# Patient Record
Sex: Male | Born: 1990 | Race: White | Hispanic: No | Marital: Single | Smoking: Current every day smoker
Health system: Southern US, Community
[De-identification: ages and names within clinical notes are randomized; demographics above are authoritative.]

## PROBLEM LIST (undated history)

## (undated) DIAGNOSIS — Z5189 Encounter for other specified aftercare: Secondary | ICD-10-CM

## (undated) DIAGNOSIS — Z978 Presence of other specified devices: Secondary | ICD-10-CM

## (undated) DIAGNOSIS — F1910 Other psychoactive substance abuse, uncomplicated: Secondary | ICD-10-CM

## (undated) DIAGNOSIS — R569 Unspecified convulsions: Secondary | ICD-10-CM

## (undated) HISTORY — DX: Unspecified convulsions: R56.9

## (undated) HISTORY — DX: Encounter for other specified aftercare: Z51.89

## (undated) HISTORY — DX: Other psychoactive substance abuse, uncomplicated: F19.10

---
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
## 2010-12-14 LAB — PROTIME-INR
INR: 1
Prothrombin Time: 13.9

## 2010-12-14 LAB — POCT I-STAT, CHEM 8
BUN: 13
Calcium, Ion: 1.19
Chloride: 103
Creatinine, Ser: 1.1
Glucose, Bld: 110 — ABNORMAL HIGH
Hemoglobin: 15.3
Potassium: 3.6
Sodium: 141
TCO2: 28

## 2010-12-14 LAB — CBC
HCT: 42.6
MCV: 88.4

## 2012-12-18 DIAGNOSIS — R739 Hyperglycemia, unspecified: Secondary | ICD-10-CM | POA: Insufficient documentation

## 2012-12-18 DIAGNOSIS — F1410 Cocaine abuse, uncomplicated: Secondary | ICD-10-CM | POA: Insufficient documentation

## 2012-12-18 DIAGNOSIS — F1190 Opioid use, unspecified, uncomplicated: Secondary | ICD-10-CM | POA: Insufficient documentation

## 2012-12-18 DIAGNOSIS — F1210 Cannabis abuse, uncomplicated: Secondary | ICD-10-CM | POA: Insufficient documentation

## 2013-03-18 DIAGNOSIS — R569 Unspecified convulsions: Secondary | ICD-10-CM

## 2013-03-18 HISTORY — DX: Unspecified convulsions: R56.9

## 2015-09-15 ENCOUNTER — Ambulatory Visit: Payer: Self-pay | Admitting: Family Medicine

## 2015-09-21 ENCOUNTER — Encounter: Payer: Self-pay | Admitting: Family Medicine

## 2015-09-21 ENCOUNTER — Ambulatory Visit (INDEPENDENT_AMBULATORY_CARE_PROVIDER_SITE_OTHER): Payer: Federal, State, Local not specified - PPO | Admitting: Family Medicine

## 2015-09-21 VITALS — BP 103/62 | HR 59 | Temp 97.5°F | Ht 72.0 in | Wt 166.8 lb

## 2015-09-21 DIAGNOSIS — M79609 Pain in unspecified limb: Secondary | ICD-10-CM

## 2015-09-21 DIAGNOSIS — M25512 Pain in left shoulder: Secondary | ICD-10-CM | POA: Diagnosis not present

## 2015-09-21 DIAGNOSIS — T8789 Other complications of amputation stump: Secondary | ICD-10-CM | POA: Diagnosis not present

## 2015-09-21 NOTE — Patient Instructions (Signed)
Great to meet you!  I recommend evaluation by a disability doctor for your disability evaluation.   Consider discussing your shoulder pain with your orthopedic surgeon, they may also be a good place to discuss disability with  For your back pain- try tylenol and core strengthening exercises for long term improvement. See the handout  We will call with a referral to a pain doctor
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
## 2017-02-03 ENCOUNTER — Emergency Department (HOSPITAL_COMMUNITY): Payer: Federal, State, Local not specified - PPO

## 2017-02-03 ENCOUNTER — Encounter (HOSPITAL_COMMUNITY): Payer: Self-pay | Admitting: Emergency Medicine

## 2017-02-03 ENCOUNTER — Emergency Department (HOSPITAL_COMMUNITY)
Admission: EM | Admit: 2017-02-03 | Discharge: 2017-02-04 | Disposition: A | Discharge status: Home / Self Care | Payer: Federal, State, Local not specified - PPO | Attending: Emergency Medicine | Admitting: Emergency Medicine

## 2017-02-03 DIAGNOSIS — Y999 Unspecified external cause status: Secondary | ICD-10-CM | POA: Diagnosis not present

## 2017-02-03 DIAGNOSIS — S060X9A Concussion with loss of consciousness of unspecified duration, initial encounter: Secondary | ICD-10-CM | POA: Insufficient documentation

## 2017-02-03 DIAGNOSIS — S0081XA Abrasion of other part of head, initial encounter: Secondary | ICD-10-CM | POA: Insufficient documentation

## 2017-02-03 DIAGNOSIS — F17210 Nicotine dependence, cigarettes, uncomplicated: Secondary | ICD-10-CM | POA: Diagnosis not present

## 2017-02-03 DIAGNOSIS — S3991XA Unspecified injury of abdomen, initial encounter: Secondary | ICD-10-CM | POA: Diagnosis not present

## 2017-02-03 DIAGNOSIS — Y92410 Unspecified street and highway as the place of occurrence of the external cause: Secondary | ICD-10-CM | POA: Insufficient documentation

## 2017-02-03 DIAGNOSIS — S161XXA Strain of muscle, fascia and tendon at neck level, initial encounter: Secondary | ICD-10-CM | POA: Insufficient documentation

## 2017-02-03 DIAGNOSIS — Z23 Encounter for immunization: Secondary | ICD-10-CM | POA: Insufficient documentation

## 2017-02-03 DIAGNOSIS — S299XXA Unspecified injury of thorax, initial encounter: Secondary | ICD-10-CM | POA: Insufficient documentation

## 2017-02-03 DIAGNOSIS — R400 Somnolence: Secondary | ICD-10-CM | POA: Diagnosis not present

## 2017-02-03 DIAGNOSIS — S0990XA Unspecified injury of head, initial encounter: Secondary | ICD-10-CM | POA: Diagnosis present

## 2017-02-03 DIAGNOSIS — Y939 Activity, unspecified: Secondary | ICD-10-CM | POA: Insufficient documentation

## 2017-02-03 DIAGNOSIS — S298XXA Other specified injuries of thorax, initial encounter: Secondary | ICD-10-CM

## 2017-02-03 HISTORY — DX: Presence of other specified devices: Z97.8

## 2017-02-03 MED ORDER — TETANUS-DIPHTH-ACELL PERTUSSIS 5-2.5-18.5 LF-MCG/0.5 IM SUSP
0.5000 mL | Freq: Once | INTRAMUSCULAR | Status: AC
  Administered 2017-02-03: 0.5 mL via INTRAMUSCULAR
  Filled 2017-02-03: qty 0.5

## 2017-02-03 MED ORDER — IOPAMIDOL (ISOVUE-300) INJECTION 61%
INTRAVENOUS | Status: AC
  Filled 2017-02-03: qty 100
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
## 2017-02-04 ENCOUNTER — Emergency Department (HOSPITAL_COMMUNITY): Payer: Federal, State, Local not specified - PPO

## 2017-02-04 LAB — CBC
HEMATOCRIT: 38.3 % — AB (ref 39.0–52.0)
HEMOGLOBIN: 13.2 g/dL (ref 13.0–17.0)
MCH: 30.3 pg (ref 26.0–34.0)
MCHC: 34.5 g/dL (ref 30.0–36.0)
MCV: 88 fL (ref 78.0–100.0)
Platelets: 180 10*3/uL (ref 150–400)
RBC: 4.35 MIL/uL (ref 4.22–5.81)
RDW: 13.2 % (ref 11.5–15.5)
WBC: 11.1 10*3/uL — ABNORMAL HIGH (ref 4.0–10.5)

## 2017-02-04 LAB — SAMPLE TO BLOOD BANK

## 2017-02-04 LAB — COMPREHENSIVE METABOLIC PANEL
ALBUMIN: 3.9 g/dL (ref 3.5–5.0)
ALK PHOS: 65 U/L (ref 38–126)
ALT: 16 U/L — ABNORMAL LOW (ref 17–63)
ANION GAP: 8 (ref 5–15)
AST: 28 U/L (ref 15–41)
BUN: 12 mg/dL (ref 6–20)
CHLORIDE: 103 mmol/L (ref 101–111)
CO2: 24 mmol/L (ref 22–32)
Calcium: 8.9 mg/dL (ref 8.9–10.3)
Creatinine, Ser: 0.88 mg/dL (ref 0.61–1.24)
GFR calc Af Amer: >60 mL/min (ref >60)
GFR calc non Af Amer: >60 mL/min (ref >60)
GLUCOSE: 93 mg/dL (ref 65–99)
POTASSIUM: 3.8 mmol/L (ref 3.5–5.1)
SODIUM: 135 mmol/L (ref 135–145)
Total Bilirubin: 0.3 mg/dL (ref 0.3–1.2)
Total Protein: 6.1 g/dL — ABNORMAL LOW (ref 6.5–8.1)

## 2017-02-04 LAB — PROTIME-INR
INR: 1.04
Prothrombin Time: 13.5 seconds (ref 11.4–15.2)

## 2017-02-04 LAB — CDS SEROLOGY

## 2017-02-04 MED ORDER — IOPAMIDOL (ISOVUE-300) INJECTION 61%
100.0000 mL | Freq: Once | INTRAVENOUS | Status: AC | PRN
  Administered 2017-02-04: 100 mL via INTRAVENOUS
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
## 2021-04-06 ENCOUNTER — Ambulatory Visit: Payer: Federal, State, Local not specified - PPO | Admitting: Nurse Practitioner

## 2022-09-05 ENCOUNTER — Ambulatory Visit: Payer: Medicaid Other | Admitting: Nurse Practitioner

## 2022-09-05 ENCOUNTER — Encounter: Payer: Self-pay | Admitting: Nurse Practitioner

## 2022-09-05 VITALS — BP 112/64 | HR 86 | Temp 98.3°F | Ht 72.0 in | Wt 165.8 lb

## 2022-09-05 DIAGNOSIS — Z0001 Encounter for general adult medical examination with abnormal findings: Secondary | ICD-10-CM

## 2022-09-05 DIAGNOSIS — Z716 Tobacco abuse counseling: Secondary | ICD-10-CM

## 2022-09-05 DIAGNOSIS — S78111S Complete traumatic amputation at level between right hip and knee, sequela: Secondary | ICD-10-CM | POA: Insufficient documentation

## 2022-09-05 DIAGNOSIS — F10229 Alcohol dependence with intoxication, unspecified: Secondary | ICD-10-CM | POA: Diagnosis not present

## 2022-09-05 DIAGNOSIS — Z7689 Persons encountering health services in other specified circumstances: Secondary | ICD-10-CM | POA: Insufficient documentation

## 2022-09-05 DIAGNOSIS — M79609 Pain in unspecified limb: Secondary | ICD-10-CM

## 2022-09-05 DIAGNOSIS — F1920 Other psychoactive substance dependence, uncomplicated: Secondary | ICD-10-CM

## 2022-09-05 DIAGNOSIS — T8789 Other complications of amputation stump: Secondary | ICD-10-CM

## 2022-09-05 DIAGNOSIS — Z0000 Encounter for general adult medical examination without abnormal findings: Secondary | ICD-10-CM | POA: Insufficient documentation

## 2022-09-05 DIAGNOSIS — F1921 Other psychoactive substance dependence, in remission: Secondary | ICD-10-CM | POA: Insufficient documentation

## 2022-09-05 NOTE — Progress Notes (Signed)
New Patient Office Visit  Subjective    Patient ID: Johnny Lewis, male    DOB: 1990-04-04  Age: 32 y.o. MRN: [id number]  CC:  Chief Complaint  Patient presents with   Establish Care    HPI Johnny Lewis presents to establish care with physical. Reports being health with no past medical dx and currently not taking any medication. Works as an Geophysicist/field seismologist care, pain).  Right above the knee amputation 2010, denies stump pain Smokes 1/2 pack daily, started smoking at 32 yrs old; not ready to quit [facility] opioid sober for 1-yr. Denies mental and physical cravings Endorse drinking  daily drinking 12 packs in one sitting, started ETOH at 32 yrs old. Denies the needs for counseling or medications  No recent labs   Outpatient Encounter Medications as of 09/05/2022  Medication Sig   fexofenadine (ALLEGRA) 60 MG tablet Take 60 mg by mouth 2 (two) times daily.   No facility-administered encounter medications on file as of 09/05/2022.    Past Medical History:  Diagnosis Date   Blood transfusion without reported diagnosis 09/2008   Seizures (HCC) 2015   Substance abuse (HCC)    32 years old until 32 years old   Uses prosthesis    R leg    Past Surgical History:  Procedure Laterality Date   FRACTURE SURGERY  2010   broken right leg    Family History  Problem Relation Age of Onset   Heart disease Maternal Grandmother    Hypertension Maternal Grandmother    Hearing loss Paternal Grandfather     Social History   Socioeconomic History   Marital status: Single    Spouse name: Not on file   Number of children: Not on file   Years of education: Not on file   Highest education level: Not on file  Occupational History   Not on file  Tobacco Use   Smoking status: Every Day    Packs/day: .5    Types: Cigarettes   Smokeless tobacco: Never  Vaping Use   Vaping Use: Never used  Substance and Sexual Activity   Alcohol use: Yes    Alcohol/week: 20.0 standard drinks of alcohol     Types: 20 Cans of beer per week   Drug use: Not Currently   Sexual activity: Yes  Other Topics Concern   Not on file  Social History Narrative   Not on file   Social Determinants of Health   Financial Resource Strain: Medium Risk (09/05/2022)   Overall Financial Resource Strain (CARDIA)    Difficulty of Paying Living Expenses: Somewhat hard  Food Insecurity: No Food Insecurity (09/05/2022)   Hunger Vital Sign    Worried About Running Out of Food in the Last Year: Never true    Ran Out of Food in the Last Year: Never true  Transportation Needs: No Transportation Needs (09/05/2022)   PRAPARE - Administrator, Civil Service (Medical): No    Lack of Transportation (Non-Medical): No  Physical Activity: Not on file  Stress: Not on file  Social Connections: Not on file  Intimate Partner Violence: Not on file    ROS Negative unless indicated in HPI   Objective    BP 112/64   Pulse 86   Temp 98.3 F (36.8 C) (Temporal)   Ht 6' (1.829 m)   Wt 165 lb 12.8 oz (75.2 kg)   SpO2 97%   BMI 22.49 kg/m   Physical Exam Vitals and nursing  note reviewed.  Constitutional:      Appearance: Normal appearance. He is not ill-appearing.  HENT:     Head: Normocephalic and atraumatic.  Eyes:     General: No scleral icterus.    Extraocular Movements: Extraocular movements intact.     Conjunctiva/sclera: Conjunctivae normal.     Pupils: Pupils are equal, round, and reactive to light.  Cardiovascular:     Rate and Rhythm: Normal rate.     Heart sounds: Normal heart sounds.  Pulmonary:     Effort: Pulmonary effort is normal.     Breath sounds: Normal breath sounds.  Abdominal:     General: Abdomen is flat. Bowel sounds are normal.     Palpations: Abdomen is soft.     Tenderness: There is no abdominal tenderness. There is no right CVA tenderness, left CVA tenderness, guarding or rebound.  Musculoskeletal:        General: Normal range of motion.     Cervical back: Normal  range of motion and neck supple.     Comments: No stump, wear a prothesis     Right Lower Extremity: Right leg is amputated above knee.  Skin:    General: Skin is warm and dry.     Findings: No rash.     Comments: BUE tattoos   Neurological:     General: No focal deficit present.     Mental Status: He is alert and oriented to person, place, and time. Mental status is at baseline.  Psychiatric:        Attention and Perception: Attention and perception normal.        Mood and Affect: Affect is not flat.        Speech: Speech normal.        Behavior: Behavior normal. Behavior is not slowed. Behavior is cooperative.        Thought Content: Thought content normal. Thought content does not include homicidal or suicidal ideation. Thought content does not include homicidal or suicidal plan.        Cognition and Memory: Cognition and memory normal.        Judgment: Judgment normal.     Comments: Very guarded     Last CBC Lab Results  Component Value Date   WBC 11.1 (H) 02/03/2017   HGB 13.2 02/03/2017   HCT 38.3 (L) 02/03/2017   MCV 88.0 02/03/2017   MCH 30.3 02/03/2017   RDW 13.2 02/03/2017   PLT 180 02/03/2017   Last metabolic panel Lab Results  Component Value Date   GLUCOSE 93 02/03/2017   NA 135 02/03/2017   K 3.8 02/03/2017   CL 103 02/03/2017   CO2 24 02/03/2017   BUN 12 02/03/2017   CREATININE 0.88 02/03/2017   GFRNONAA >60 02/03/2017   CALCIUM 8.9 02/03/2017   PROT 6.1 (L) 02/03/2017   ALBUMIN 3.9 02/03/2017   BILITOT 0.3 02/03/2017   ALKPHOS 65 02/03/2017   AST 28 02/03/2017   ALT 16 (L) 02/03/2017   ANIONGAP 8 02/03/2017         Assessment & Plan:  Encounter to establish care -     CBC with Differential/Platelet -     CMP14+EGFR -     Lipid panel -     Thyroid Panel With TSH  Routine medical exam  Alcohol dependence with intoxication with complication (HCC) -     CBC with Differential/Platelet -     CMP14+EGFR  Encounter for smoking cessation  counseling  Stump pain (HCC) -  CBC with Differential/Platelet  Polysubstance (including opioids) dependence with physiol dependence (HCC)  ETOH abuse  refused treatment Polysubstance use  refused treatment Labs: CBC, CMP, Lipid, TSH Stump pain resolved Labs: CBC, CMP, Lipid, TSH, A1C  Return in about 6 months (around 03/07/2023).   Arrie Aran Santa Lighter, NP

## 2022-09-06 LAB — CBC WITH DIFFERENTIAL/PLATELET
Basophils Absolute: 0 10*3/uL (ref 0.0–0.2)
Basos: 0 %
EOS (ABSOLUTE): 0.2 10*3/uL (ref 0.0–0.4)
Eos: 3 %
Hematocrit: 42.3 % (ref 37.5–51.0)
Hemoglobin: 14 g/dL (ref 13.0–17.7)
Immature Grans (Abs): 0 10*3/uL (ref 0.0–0.1)
Immature Granulocytes: 0 %
Lymphocytes Absolute: 1.9 10*3/uL (ref 0.7–3.1)
Lymphs: 35 %
MCH: 29.3 pg (ref 26.6–33.0)
MCHC: 33.1 g/dL (ref 31.5–35.7)
MCV: 89 fL (ref 79–97)
Monocytes Absolute: 0.3 10*3/uL (ref 0.1–0.9)
Monocytes: 6 %
Neutrophils Absolute: 3.1 10*3/uL (ref 1.4–7.0)
Neutrophils: 56 %
Platelets: 207 10*3/uL (ref 150–450)
RBC: 4.78 x10E6/uL (ref 4.14–5.80)
RDW: 13 % (ref 11.6–15.4)
WBC: 5.5 10*3/uL (ref 3.4–10.8)

## 2022-09-06 LAB — CMP14+EGFR
ALT: 13 IU/L (ref 0–44)
AST: 14 IU/L (ref 0–40)
Albumin: 4.6 g/dL (ref 4.1–5.1)
Alkaline Phosphatase: 80 IU/L (ref 44–121)
BUN/Creatinine Ratio: 24 — ABNORMAL HIGH (ref 9–20)
BUN: 22 mg/dL — ABNORMAL HIGH (ref 6–20)
Bilirubin Total: 0.3 mg/dL (ref 0.0–1.2)
CO2: 24 mmol/L (ref 20–29)
Calcium: 9.3 mg/dL (ref 8.7–10.2)
Chloride: 104 mmol/L (ref 96–106)
Creatinine, Ser: 0.91 mg/dL (ref 0.76–1.27)
Globulin, Total: 2 g/dL (ref 1.5–4.5)
Glucose: 104 mg/dL — ABNORMAL HIGH (ref 70–99)
Potassium: 4.1 mmol/L (ref 3.5–5.2)
Sodium: 142 mmol/L (ref 134–144)
Total Protein: 6.6 g/dL (ref 6.0–8.5)
eGFR: 116 mL/min/{1.73_m2} (ref >59)

## 2022-09-06 LAB — BAYER DCA HB A1C WAIVED: HB A1C (BAYER DCA - WAIVED): 5.6 % (ref 4.8–5.6)

## 2022-09-06 LAB — THYROID PANEL WITH TSH
Free Thyroxine Index: 2 (ref 1.2–4.9)
T3 Uptake Ratio: 29 % (ref 24–39)
T4, Total: 6.8 ug/dL (ref 4.5–12.0)
TSH: 1.04 u[IU]/mL (ref 0.450–4.500)

## 2022-09-06 LAB — LIPID PANEL
Chol/HDL Ratio: 3.4 ratio (ref 0.0–5.0)
Cholesterol, Total: 144 mg/dL (ref 100–199)
HDL: 42 mg/dL (ref >39)
LDL Chol Calc (NIH): 84 mg/dL (ref 0–99)
Triglycerides: 97 mg/dL (ref 0–149)
VLDL Cholesterol Cal: 18 mg/dL (ref 5–40)

## 2022-12-25 ENCOUNTER — Ambulatory Visit: Payer: Medicaid Other | Admitting: Nurse Practitioner

## 2022-12-25 ENCOUNTER — Encounter: Payer: Self-pay | Admitting: Nurse Practitioner

## 2022-12-26 ENCOUNTER — Ambulatory Visit: Payer: Medicaid Other | Admitting: Nurse Practitioner

## 2022-12-26 ENCOUNTER — Encounter: Payer: Self-pay | Admitting: Nurse Practitioner

## 2022-12-26 VITALS — BP 109/60 | HR 94 | Temp 98.2°F | Ht 72.0 in | Wt 153.4 lb

## 2022-12-26 DIAGNOSIS — R634 Abnormal weight loss: Secondary | ICD-10-CM | POA: Diagnosis not present

## 2022-12-26 MED ORDER — ENSURE MAX PROTEIN PO LIQD: 11.0000 [oz_av] | Freq: Every day | ORAL | 2 refills | Status: DC

## 2022-12-26 NOTE — Progress Notes (Signed)
Established Patient Office Visit  Subjective   Patient ID: Johnny Lewis, male    DOB: 1990-07-30  Age: 32 y.o. MRN: [id number]  Chief Complaint  Patient presents with   infection    Had infection on finger and didn't realize until infection spread and has lost a bunch of weight in past 1.5 months    HPI Weight loss Last weigh 09/04/21 165 lbs, today 12/26/22 153 lbs oncerns of unintentional weight loss, having lost 12 pounds since the last visit. He reports a history of engaging in unprotected sex with multiple partners, totaling six partners in the last five months. The patient denies any accompanying symptoms such as fever, night sweats, or changes in appetite. There are no other significant changes in health or lifestyle noted since the last visit. 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, right, sequela (HCC) 09/05/2022   Stump pain (HCC) 09/21/2015   Cocaine abuse (HCC) 12/18/2012   Hyperglycemia 12/18/2012   Marijuana abuse 12/18/2012   Opiate use 12/18/2012   Past Medical History:  Diagnosis Date   Blood transfusion without reported diagnosis 09/2008   Seizures (HCC) 2015   Substance abuse (HCC)    32 years old until 32 years old   Uses prosthesis    R leg   Past Surgical History:  Procedure Laterality Date   FRACTURE SURGERY  2010   broken right leg   Social History    Tobacco Use   Smoking status: Every Day    Current packs/day: 0.50    Types: Cigarettes   Smokeless tobacco: Never  Vaping Use   Vaping status: Never Used  Substance Use Topics   Alcohol use: Yes    Alcohol/week: 20.0 standard drinks of alcohol    Types: 20 Cans of beer per week   Drug use: Not Currently   Social History   Socioeconomic History   Marital status: Single    Spouse name: Not on file   Number of children: Not on file   Years of education: Not on file   Highest education level: Not on file  Occupational History   Not on file  Tobacco Use   Smoking status: Every Day    Current packs/day: 0.50    Types: Cigarettes   Smokeless tobacco: Never  Vaping Use   Vaping status: Never Used  Substance and Sexual Activity   Alcohol use: Yes    Alcohol/week: 20.0 standard drinks of alcohol    Types: 20 Cans of beer per week   Drug use: Not Currently   Sexual activity: Yes  Other Topics Concern   Not on file  Social History Narrative   Not on file   Social Determinants of Health   Financial Resource Strain: Medium Risk (09/05/2022)   Overall Financial Resource Strain (CARDIA)    Difficulty of Paying Living Expenses: Somewhat hard  Food Insecurity: No Food Insecurity (09/05/2022)   Hunger Vital Sign    Worried About Running  Out of Food in the Last Year: Never true    Ran Out of Food in the Last Year: Never true  Transportation Needs: No Transportation Needs (09/05/2022)   PRAPARE - Administrator, Civil Service (Medical): No    Lack of Transportation (Non-Medical): No  Physical Activity: Not on file  Stress: Not on file  Social Connections: Not on file  Intimate Partner Violence: Not on file   Family Status  Relation Name Status   Mother  Alive   Father  Alive   Brother  Alive   MGM  Deceased   MGF  Deceased   PGM  Deceased   PGF  Deceased  No partnership data on file   Family History  Problem Relation Age of Onset   Heart disease  Maternal Grandmother    Hypertension Maternal Grandmother    Hearing loss Paternal Grandfather    No Known Allergies    Review of Systems  Constitutional:  Positive for weight loss. Negative for chills, fever and malaise/fatigue.       Unintentional  HENT:  Negative for ear pain and sore throat.   Respiratory:  Negative for hemoptysis, shortness of breath and wheezing.   Cardiovascular:  Negative for chest pain and leg swelling.  Genitourinary:  Negative for frequency, hematuria and urgency.  Musculoskeletal:  Negative for falls.  Skin:  Negative for itching and rash.  Neurological:  Negative for dizziness, tingling and headaches.  Endo/Heme/Allergies:  Negative for environmental allergies and polydipsia. Does not bruise/bleed easily.  Psychiatric/Behavioral:  Negative for depression and hallucinations. The patient does not have insomnia.    Negative unless indicated in HPI   Objective:     BP 109/60   Pulse 94   Temp 98.2 F (36.8 C) (Temporal)   Ht 6' (1.829 m)   Wt 153 lb 6.4 oz (69.6 kg)   SpO2 97%   BMI 20.80 kg/m  BP Readings from Last 3 Encounters:  12/26/22 109/60  09/05/22 112/64  02/04/17 122/74   Wt Readings from Last 3 Encounters:  12/26/22 153 lb 6.4 oz (69.6 kg)  09/05/22 165 lb 12.8 oz (75.2 kg)  02/03/17 175 lb (79.4 kg)      Physical Exam Constitutional:      Appearance: Normal appearance. He is normal weight.  HENT:     Head: Normocephalic and atraumatic.  Eyes:     Extraocular Movements: Extraocular movements intact.     Conjunctiva/sclera: Conjunctivae normal.     Pupils: Pupils are equal, round, and reactive to light.  Cardiovascular:     Rate and Rhythm: Normal rate and regular rhythm.     Pulses: Normal pulses.     Heart sounds: Normal heart sounds.  Pulmonary:     Effort: Pulmonary effort is normal.     Breath sounds: Normal breath sounds.  Musculoskeletal:     Cervical back: Normal range of motion and neck supple.  Skin:     General: Skin is warm and dry.     Findings: No rash.  Neurological:     Mental Status: He is alert and oriented to person, place, and time. Mental status is at baseline.  Psychiatric:        Mood and Affect: Mood normal.        Behavior: Behavior normal.        Thought Content: Thought content normal.        Judgment: Judgment normal.      No results found for any  visits on 12/26/22.  Last CBC Lab Results  Component Value Date   WBC 5.5 09/05/2022   HGB 14.0 09/05/2022   HCT 42.3 09/05/2022   MCV 89 09/05/2022   MCH 29.3 09/05/2022   RDW 13.0 09/05/2022   PLT 207 09/05/2022   Last metabolic panel Lab Results  Component Value Date   GLUCOSE 104 (H) 09/05/2022   NA 142 09/05/2022   K 4.1 09/05/2022   CL 104 09/05/2022   CO2 24 09/05/2022   BUN 22 (H) 09/05/2022   CREATININE 0.91 09/05/2022   EGFR 116 09/05/2022   CALCIUM 9.3 09/05/2022   PROT 6.6 09/05/2022   ALBUMIN 4.6 09/05/2022   LABGLOB 2.0 09/05/2022   BILITOT 0.3 09/05/2022   ALKPHOS 80 09/05/2022   AST 14 09/05/2022   ALT 13 09/05/2022   ANIONGAP 8 02/03/2017   Last lipids Lab Results  Component Value Date   CHOL 144 09/05/2022   HDL 42 09/05/2022   LDLCALC 84 09/05/2022   TRIG 97 09/05/2022   CHOLHDL 3.4 09/05/2022   Last hemoglobin A1c Lab Results  Component Value Date   HGBA1C 5.6 09/05/2022   Last thyroid functions Lab Results  Component Value Date   TSH 1.040 09/05/2022   T4TOTAL 6.8 09/05/2022        Assessment & Plan:  Weight loss, non-intentional -     HIV Antibody (routine testing w rflx) -     PE and FLC, Serum -     Hepatic function panel -     C-reactive protein -     CBC with Differential/Platelet -     Ensure Max Protein; Take 330 mLs (11 oz total) by mouth daily.  Dispense: 330 mL; Refill: 2 -     HepB+HepC+HIV Panel -     Herpes simplex virus culture -     RPR   Johnny Lewis is 76 yrs olf male, no acute distress Unintentional weight loss: Ensure BID Labs: C-reactive  protein, CBC,HepB+HepC+HIV Panel, Herpes simplex virus culture   PE and FLC, Serum, RPR     -Boost supplement 1-can daily  Encourage healthy lifestyle choices, including diet (rich in fruits, vegetables, and lean proteins, and low in salt and simple carbohydrates) and exercise (at least 30 minutes of moderate physical activity daily).     The above assessment and management plan was discussed with the patient. The patient verbalized understanding of and has agreed to the management plan. Patient is aware to call the clinic if they develop any new symptoms or if symptoms persist or worsen. Patient is aware when to return to the clinic for a follow-up visit. Patient educated on when it is appropriate to go to the emergency department.  Return in about 6 weeks (around 02/06/2023).    Arrie Aran Santa Lighter, DNP [facility] [address] [place], [place] [address] [phone]

## 2022-12-29 LAB — PE AND FLC, SERUM
A/G Ratio: 1.4 (ref 0.7–1.7)
Albumin ELP: 3.7 g/dL (ref 2.9–4.4)
Alpha 1: 0.3 g/dL (ref 0.0–0.4)
Alpha 2: 0.7 g/dL (ref 0.4–1.0)
Beta: 0.9 g/dL (ref 0.7–1.3)
Gamma Globulin: 0.8 g/dL (ref 0.4–1.8)
Globulin, Total: 2.7 g/dL (ref 2.2–3.9)
Ig Kappa Free Light Chain: 11.9 mg/L (ref 3.3–19.4)
Ig Lambda Free Light Chain: 14.5 mg/L (ref 5.7–26.3)
KAPPA/LAMBDA RATIO: 0.82 (ref 0.26–1.65)
Total Protein: 6.4 g/dL (ref 6.0–8.5)

## 2022-12-29 LAB — CBC WITH DIFFERENTIAL/PLATELET
Basophils Absolute: 0.1 10*3/uL (ref 0.0–0.2)
Basos: 1 %
EOS (ABSOLUTE): 0.5 10*3/uL — ABNORMAL HIGH (ref 0.0–0.4)
Eos: 10 %
Hematocrit: 40.5 % (ref 37.5–51.0)
Hemoglobin: 13.1 g/dL (ref 13.0–17.7)
Immature Grans (Abs): 0 10*3/uL (ref 0.0–0.1)
Immature Granulocytes: 0 %
Lymphocytes Absolute: 1.4 10*3/uL (ref 0.7–3.1)
Lymphs: 30 %
MCH: 29.4 pg (ref 26.6–33.0)
MCHC: 32.3 g/dL (ref 31.5–35.7)
MCV: 91 fL (ref 79–97)
Monocytes Absolute: 0.4 10*3/uL (ref 0.1–0.9)
Monocytes: 9 %
Neutrophils Absolute: 2.4 10*3/uL (ref 1.4–7.0)
Neutrophils: 50 %
Platelets: 302 10*3/uL (ref 150–450)
RBC: 4.45 x10E6/uL (ref 4.14–5.80)
RDW: 12.8 % (ref 11.6–15.4)
WBC: 4.8 10*3/uL (ref 3.4–10.8)

## 2022-12-29 LAB — HEPB+HEPC+HIV PANEL
HIV Screen 4th Generation wRfx: NONREACTIVE
Hep B C IgM: NEGATIVE
Hep B Core Total Ab: NEGATIVE
Hep B E Ab: NONREACTIVE
Hep B E Ag: NEGATIVE
Hep B Surface Ab, Qual: NONREACTIVE
Hep C Virus Ab: NONREACTIVE
Hepatitis B Surface Ag: NEGATIVE

## 2022-12-29 LAB — C-REACTIVE PROTEIN: CRP: 2 mg/L (ref 0–10)

## 2022-12-29 LAB — HEPATIC FUNCTION PANEL
ALT: 22 [IU]/L (ref 0–44)
AST: 26 [IU]/L (ref 0–40)
Albumin: 4.1 g/dL (ref 4.1–5.1)
Alkaline Phosphatase: 70 [IU]/L (ref 44–121)
Bilirubin Total: <0.2 mg/dL (ref 0.0–1.2)
Bilirubin, Direct: <0.1 mg/dL (ref 0.00–0.40)

## 2022-12-29 LAB — RPR: RPR Ser Ql: NONREACTIVE

## 2023-01-30 ENCOUNTER — Ambulatory Visit: Payer: Medicaid Other | Admitting: Nurse Practitioner

## 2023-02-10 ENCOUNTER — Ambulatory Visit: Payer: Medicaid Other | Admitting: Nurse Practitioner

## 2023-02-10 NOTE — Progress Notes (Deleted)
Established Patient Office Visit  Subjective   Patient ID: Johnny Lewis, male    DOB: Jun 16, 1990  Age: 32 y.o. MRN: [id number]  No chief complaint on file.   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, right, sequela (HCC) 09/05/2022   Stump pain (HCC) 09/21/2015   Cocaine abuse (HCC) 12/18/2012   Hyperglycemia 12/18/2012   Marijuana abuse 12/18/2012   Opiate use 12/18/2012   Past Medical History:  Diagnosis Date   Blood transfusion without reported diagnosis 09/2008   Seizures (HCC) 2015   Substance abuse (HCC)    32 years old until 32 years old   Uses prosthesis    R leg   Past Surgical History:  Procedure Laterality Date   FRACTURE SURGERY  2010   broken right leg   Social History   Tobacco Use   Smoking status: Every Day    Current packs/day: 0.50    Types: Cigarettes   Smokeless tobacco: Never  Vaping Use   Vaping status: Never Used  Substance Use Topics   Alcohol use: Yes    Alcohol/week: 20.0 standard drinks of alcohol    Types: 20 Cans of beer per week   Drug use: Not Currently   Social History   Socioeconomic History   Marital status: Single    Spouse name: Not on file   Number of children: Not on file   Years of education: Not on file   Highest education level: Not on file  Occupational History   Not on file  Tobacco Use   Smoking status: Every Day    Current packs/day: 0.50    Types: Cigarettes   Smokeless tobacco: Never  Vaping Use   Vaping status:  Never Used  Substance and Sexual Activity   Alcohol use: Yes    Alcohol/week: 20.0 standard drinks of alcohol    Types: 20 Cans of beer per week   Drug use: Not Currently   Sexual activity: Yes  Other Topics Concern   Not on file  Social History Narrative   Not on file   Social Determinants of Health   Financial Resource Strain: Medium Risk (09/05/2022)   Overall Financial Resource Strain (CARDIA)    Difficulty of Paying Living Expenses: Somewhat hard  Food Insecurity: No Food Insecurity (09/05/2022)   Hunger Vital Sign    Worried About Running Out of Food in the Last Year: Never true    Ran Out of Food in the Last Year: Never true  Transportation Needs: No Transportation Needs (09/05/2022)   PRAPARE - Administrator, Civil Service (Medical): No    Lack of Transportation (Non-Medical): No  Physical Activity: Not on file  Stress: Not on file  Social Connections: Not on file  Intimate Partner Violence: Not on file   Family Status  Relation Name Status   Mother  Alive   Father  Alive   Brother  Alive   MGM  Deceased   MGF  Deceased   PGM  Deceased   PGF  Deceased  No partnership data on file  Family History  Problem Relation Age of Onset   Heart disease Maternal Grandmother    Hypertension Maternal Grandmother    Hearing loss Paternal Grandfather    No Known Allergies    ROS Negative unless indicated in HPI   Objective:     There were no vitals taken for this visit. BP Readings from Last 3 Encounters:  12/26/22 109/60  09/05/22 112/64  02/04/17 122/74   Wt Readings from Last 3 Encounters:  12/26/22 153 lb 6.4 oz (69.6 kg)  09/05/22 165 lb 12.8 oz (75.2 kg)  02/03/17 175 lb (79.4 kg)      Physical Exam   No results found for any visits on 02/10/23.  Last CBC Lab Results  Component Value Date   WBC 4.8 12/26/2022   HGB 13.1 12/26/2022   HCT 40.5 12/26/2022   MCV 91 12/26/2022   MCH 29.4 12/26/2022   RDW 12.8 12/26/2022   PLT 302  12/26/2022   Last metabolic panel Lab Results  Component Value Date   GLUCOSE 104 (H) 09/05/2022   NA 142 09/05/2022   K 4.1 09/05/2022   CL 104 09/05/2022   CO2 24 09/05/2022   BUN 22 (H) 09/05/2022   CREATININE 0.91 09/05/2022   EGFR 116 09/05/2022   CALCIUM 9.3 09/05/2022   PROT 6.4 12/26/2022   ALBUMIN 4.1 12/26/2022   LABGLOB 2.7 12/26/2022   AGRATIO 1.4 12/26/2022   BILITOT <0.2 12/26/2022   ALKPHOS 70 12/26/2022   AST 26 12/26/2022   ALT 22 12/26/2022   ANIONGAP 8 02/03/2017   Last lipids Lab Results  Component Value Date   CHOL 144 09/05/2022   HDL 42 09/05/2022   LDLCALC 84 09/05/2022   TRIG 97 09/05/2022   CHOLHDL 3.4 09/05/2022   Last hemoglobin A1c Lab Results  Component Value Date   HGBA1C 5.6 09/05/2022   Last thyroid functions Lab Results  Component Value Date   TSH 1.040 09/05/2022   T4TOTAL 6.8 09/05/2022        Assessment & Plan:  There are no diagnoses linked to this encounter.  No follow-ups on file.    Arrie Aran Santa Lighter, DNP [facility] [address] [place], [place] [address] [phone]

## 2023-02-11 ENCOUNTER — Encounter: Payer: Self-pay | Admitting: Nurse Practitioner

## 2023-07-31 ENCOUNTER — Encounter: Payer: Self-pay | Admitting: Nurse Practitioner

## 2023-07-31 ENCOUNTER — Ambulatory Visit: Admitting: Nurse Practitioner

## 2023-07-31 VITALS — BP 129/72 | HR 82 | Temp 98.1°F | Ht 72.0 in | Wt 164.2 lb

## 2023-07-31 DIAGNOSIS — L309 Dermatitis, unspecified: Secondary | ICD-10-CM

## 2023-07-31 DIAGNOSIS — J321 Chronic frontal sinusitis: Secondary | ICD-10-CM | POA: Diagnosis not present

## 2023-07-31 MED ORDER — AZITHROMYCIN 250 MG PO TABS: ORAL_TABLET | ORAL | 0 refills | Status: DC

## 2023-07-31 MED ORDER — AZELASTINE HCL 0.1 % NA SOLN: 1.0000 | Freq: Two times a day (BID) | NASAL | 12 refills | Status: DC

## 2023-07-31 NOTE — Progress Notes (Signed)
 Acute Office Visit  Subjective:     Patient ID: Johnny Lewis, male    DOB: 26-Oct-1990, 33 y.o.   MRN: [id number]  Chief Complaint  Patient presents with   Nasal Congestion    Symptoms for 2 weeks    Cough   Referral    Wants referral to dermatology thinks he has eczema and rash on his leg     HPI Johnny Lewis is a 33 yrs old male presents 07/31/2023 for an acute need referral   fro dermatology. He is very poor historian Reports that his mother wants him to get a referral to Derm for Eczema. " It comes and goes may last 1-2 doses dry skin around nasal fold". Has try hydrocortisone with no relief.  .Sinus Pain: Patient complains of congestion, nasal congestion, post nasal drip, and productive cough with  yellow colored sputum. Symptoms include nasal congestion, post nasal drip, and productive cough with  yellow colored sputum with no fever, chills, night sweats or weight loss. Onset of symptoms was 3 weeks ago, gradually worsening since that time. He is drinking moderate amounts of fluids.  Past history is significant for no history of pneumonia or bronchitis.   . 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  HENT:  Positive for congestion and sinus pain.   Respiratory:  Negative for cough, shortness of breath and wheezing.   Cardiovascular:  Negative for chest pain and leg swelling.  Gastrointestinal:  Negative for nausea and vomiting.  Neurological:  Negative for dizziness and headaches.   Negative unless indicated in HPI    Objective:    BP 129/72   Pulse 82   Temp 98.1 F (36.7 C) (Temporal)   Ht 6' (1.829 m)   Wt 164 lb 3.2 oz (74.5 kg)   SpO2 95%   BMI 22.27 kg/m  BP Readings from Last 3 Encounters:  07/31/23 129/72  12/26/22 109/60  09/05/22 112/64   Wt Readings from Last 3 Encounters:  07/31/23 164 lb 3.2 oz (74.5 kg)  12/26/22 153 lb 6.4 oz (69.6 kg)  09/05/22 165 lb 12.8 oz (75.2 kg)      Physical Exam Constitutional:      General: He is not in acute distress.    Appearance: Normal appearance.  HENT:     Head: Normocephalic and atraumatic.     Right Ear: Tympanic membrane, ear canal and external ear normal. There is no impacted cerumen.     Left Ear: Tympanic membrane, ear canal and  external ear normal. There is no impacted cerumen.     Nose: Congestion and rhinorrhea present.     Right Turbinates: Swollen.     Left Turbinates: Swollen.  Eyes:     General: No scleral icterus.    Extraocular Movements: Extraocular movements intact.     Conjunctiva/sclera: Conjunctivae normal.     Pupils: Pupils are equal, round, and reactive to light.  Cardiovascular:     Heart sounds: Normal heart sounds.  Pulmonary:     Effort: Pulmonary effort is normal.     Breath sounds: Normal breath sounds.  Musculoskeletal:        General: Normal range of motion.     Right lower leg: No edema.     Left lower leg: No edema.  Skin:    General:  Skin is warm and dry.     Findings: No rash.  Neurological:     Mental Status: He is alert and oriented to person, place, and time. Mental status is at baseline.     No results found for any visits on 07/31/23.      Assessment & Plan:  Eczema, unspecified type -     Ambulatory referral to Dermatology  Sinusitis chronic, frontal -     Azithromycin; 2 tabs on the first day and 1-tab daily until done  Dispense: 6 each; Refill: 0 -     Azelastine HCl; Place 1 spray into both nostrils 2 (two) times daily. Use in each nostril as directed  Dispense: 30 mL; Refill: 12   [name] is a 33 year old Caucasian male seen today for eczema and sinusitis, no acute distress Eczema: No active rash, as requested refer to Derm Sinusitis: Astelin twice daily, Zithromax No. 6 follow instruction on - increase hydration 1. Take meds as prescribed 2. Use a cool mist humidifier especially during the winter months and when heat has been humid. 3. Use saline nose sprays frequently. 4. Saline irrigations of the nose can be very helpful if done frequently. Use 4 times daily for one week. Can use Nettie Pot if able to tolerate.  5. Drink plenty of fluids 6. Keep thermostat turned down low. 7.For any cough or congestion: Use plain Mucinex- regular strength or max strength is fine. 8. For fever or aces or pains- take tylenol or ibuprofen appropriate for age and weight, for fevers greater than 101 orally you may alternate ibuprofen and tylenol every 3 hours.    The above assessment and management plan was discussed with the patient. The patient verbalized understanding of and has agreed to the management plan. Patient is aware to call the clinic if they develop any new symptoms or if symptoms persist or worsen. Patient is aware when to return to the clinic for a follow-up visit. Patient educated on when it is appropriate to go to the emergency department.  Return if symptoms worsen or fail to improve.  Johnny Ruane St  Louis Thompson, DNP [facility] [address] [place], [place] [address] [phone]  Note: This document was prepared by [employer] voice dictation technology and any errors that results from this process are unintentional.

## 2023-10-27 ENCOUNTER — Ambulatory Visit: Admitting: Nurse Practitioner

## 2023-10-27 ENCOUNTER — Encounter: Payer: Self-pay | Admitting: Nurse Practitioner

## 2023-10-27 VITALS — BP 93/58 | HR 84 | Temp 98.5°F | Ht 72.0 in | Wt 153.0 lb

## 2023-10-27 DIAGNOSIS — L719 Rosacea, unspecified: Secondary | ICD-10-CM

## 2023-10-27 MED ORDER — METRONIDAZOLE 1 % EX GEL: Freq: Every day | CUTANEOUS | 0 refills | Status: DC

## 2023-10-27 NOTE — Patient Instructions (Signed)
 Rosacea Rosacea is a long-term (chronic) condition that affects the skin of the face, including the cheeks, nose, forehead, and chin. This condition can also affect the eyes. Rosacea causes blood vessels near the surface of the skin to enlarge, which results in redness. What are the causes? The cause of this condition is not known. Certain triggers can make rosacea worse, including: Exercise. Sunlight. Very hot or cold temperatures. Hot or spicy foods and drinks. Drinking alcohol. Stress. Taking blood pressure medicine. Long-term use of topical steroids on the face. What increases the risk? You are more likely to develop this condition if you: Are older than 33 years of age. Are a woman. Have light-colored skin (light complexion). Have a family history of rosacea. What are the signs or symptoms? Symptoms of this condition include: Redness of the face. Red bumps or pimples on the face. A red, enlarged nose. Blushing easily. Red lines on the skin. Eye problems such as: Irritated, burning, or itchy feeling in the eyes. Swollen eyelids. Drainage from the eyes. Feeling like there is something in your eye. How is this diagnosed? This condition is diagnosed with a medical history and physical exam. How is this treated? There is no cure for this condition, but treatment can help to control your symptoms. Your health care provider may recommend that you see a skin specialist (dermatologist). Treatment may include: Medicines that are applied to the skin or taken by mouth (orally). This can include antibiotic medicines. Laser treatment to improve the appearance of the skin. Surgery. This is rare. Your health care provider will also recommend the best way to take care of your skin. Even after your skin improves, you will likely need to continue treatment to prevent your rosacea from coming back. Follow these instructions at home: Skin care Take care of your skin as told by your health  care provider. You may be told to do these things: Wash your skin gently two or more times each day. Use mild soap. Use a sunscreen or sunblock with SPF 30 or greater. Use gentle cosmetics that are meant for sensitive skin. Shave with an electric shaver instead of a blade. Lifestyle Try to keep track of what foods trigger this condition. Avoid any triggers. These may include: Spicy foods. Seafood. Cheese. Hot liquids. Nuts. Chocolate. Iodized salt. Do not drink alcohol. Avoid extremely cold or hot temperatures. Try to reduce your stress. If you need help, talk with your health care provider. When you exercise, do these things to stay cool: Limit sun exposure to your face. Use a fan. Do shorter and more frequent intervals of exercise. General instructions Take and apply over-the-counter and prescription medicines only as told by your health care provider. If you were prescribed antibiotics, apply it or take them as told by your health care provider. Do not stop using the antibiotic even if your condition improves. If your eyelids are affected, apply warm compresses to them. Do this as told by your health care provider. Keep all follow-up visits. Contact a health care provider if: Your symptoms get worse. Your symptoms do not improve after 2 months of treatment. You have new symptoms. You have any changes in vision or you have problems with your eyes, such as redness or itching. You feel depressed. You lose your appetite. You have trouble concentrating. Summary Rosacea is a long-term (chronic) condition that affects the skin of the face, including the cheeks, nose, forehead, and chin. Take care of your skin as told by your health care  provider. Take and apply over-the-counter and prescription medicines only as told by your health care provider. Contact a health care provider if your symptoms get worse or if you have any changes in vision or other problems with your eyes, such as  redness or itching. Keep all follow-up visits. This information is not intended to replace advice given to you by your health care provider. Make sure you discuss any questions you have with your health care provider. Document Revised: 04/25/2021 Document Reviewed: 04/25/2021 Elsevier Patient Education  2024 [employer].

## 2023-10-27 NOTE — Progress Notes (Signed)
   Subjective:    Patient ID: Johnny Lewis, male    DOB: 07/30/1990, 33 y.o.   MRN: [id number]   Chief Complaint: rash on face  Rash Pertinent negatives include no eye pain or shortness of breath.    Patient has rash on bil cheeks. He has had frequently in the past. Occurs several times a year. Slight burning. 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  Eyes:  Negative for pain.  Respiratory:  Negative for shortness of breath.   Cardiovascular:  Negative for chest pain, palpitations and leg swelling.  Gastrointestinal:  Negative for abdominal pain.  Endocrine: Negative for polydipsia.  Skin:  Positive for rash.  Neurological:  Negative for dizziness, weakness and headaches.  Hematological:  Does not bruise/bleed easily.  All other systems reviewed and are negative.      Objective:   Physical Exam Constitutional:      Appearance: Normal appearance.  Cardiovascular:     Rate and Rhythm: Normal rate and regular rhythm.     Heart sounds: Normal heart sounds.  Pulmonary:     Breath sounds: Normal breath sounds.  Skin:    General: Skin is warm.     Findings: Rash (scaley erythematous rash on bil cheeks. - see  attached picture) present.  Neurological:     General: No focal deficit present.     Mental Status: He is alert and oriented to person, place, and time.     BP (!) 93/58   Pulse 84   Temp 98.5 F (36.9 C)   Ht 6' (1.829 m)   Wt 153 lb (69.4 kg)   SpO2 99%   BMI 20.75 kg/m        Assessment & Plan:  Johnny Lewis in today with chief complaint of Rash (Face/recurring)   1. Rosacea, acne (Primary) Keep clean and dry Do not pick at areas RTO prn - metroNIDAZOLE  (METROGEL ) 1 % gel; Apply topically daily.  Dispense: 45 g; Refill: 0    The above assessment and management plan was discussed with the patient. The patient verbalized understanding of and has agreed to the management plan. Patient is aware to call the clinic if symptoms persist or worsen. Patient is aware when to return to the clinic for a follow-up visit. Patient educated on when it is appropriate to go to the emergency department.   Mary-Margaret Lunger, FNP

## 2024-02-09 ENCOUNTER — Ambulatory Visit: Payer: Self-pay

## 2024-02-09 NOTE — Telephone Encounter (Signed)
 FYI Only or Action Required?: FYI only for provider: appointment scheduled on 11/25.  Patient was last seen in primary care on 10/27/2023 by Gladis Mustard, FNP.  Called Nurse Triage reporting Wound Check.  Symptoms began 2 days ago.  Symptoms are: gradually worsening.  Triage Disposition: See Physician Within 24 Hours  Patient/caregiver understands and will follow disposition?: Yes       Copied from CRM #[id number]. Topic: Clinical - Red Word Triage >> Feb 09, 2024  3:19 PM Montie POUR wrote: Red Word that prompted transfer to Nurse Triage:  He has a wound at the top of his right leg; it is swollen, pain at an 8; this is the leg he has a prosthetic leg.        Reason for Disposition  [1] Wound > 48 hours old AND [2] becoming more painful or tender to touch  Answer Assessment - Initial Assessment Questions 1. LOCATION: Where is the wound located?      Right leg 2. WOUND APPEARANCE: What does the wound look like?      Red and swollen 3. SIZE: If redness is present, ask: What is the size of the red area? (Inches, centimeters, or compare to size of a coin)      50 cent piece  4. SPREAD: What's changed in the last day?  Do you see any red streaks coming from the wound?     No 5. ONSET: When did it start to look infected?      2 days ago  6. MECHANISM: How did the wound start, what was the cause?     Pressure sore  7. PAIN: Do you have any pain?  If Yes, ask: How bad is the pain?  (e.g., Scale 1-10; mild, moderate, or severe)     Moderate  8. FEVER: Do you have a fever? If Yes, ask: What is your temperature, how was it measured, and when did it start?     No 9. OTHER SYMPTOMS: Do you have any other symptoms? (e.g., shaking chills, weakness, rash elsewhere on body)     No  Protocols used: Wound Infection Suspected-A-AH

## 2024-02-10 ENCOUNTER — Encounter: Payer: Self-pay | Admitting: Nurse Practitioner

## 2024-02-10 ENCOUNTER — Ambulatory Visit: Admitting: Nurse Practitioner

## 2024-02-10 VITALS — BP 122/76 | HR 93 | Temp 98.1°F | Ht 72.0 in | Wt 148.0 lb

## 2024-02-10 DIAGNOSIS — N489 Disorder of penis, unspecified: Secondary | ICD-10-CM | POA: Diagnosis not present

## 2024-02-10 DIAGNOSIS — L89211 Pressure ulcer of right hip, stage 1: Secondary | ICD-10-CM

## 2024-02-10 MED ORDER — SULFAMETHOXAZOLE-TRIMETHOPRIM 800-160 MG PO TABS: 1.0000 | ORAL_TABLET | Freq: Two times a day (BID) | ORAL | 0 refills | Status: AC

## 2024-02-10 NOTE — Progress Notes (Addendum)
 "  Subjective:    Patient ID: Johnny Lewis, male    DOB: Oct 23, 1990, 33 y.o.   MRN: [id number]   Chief Complaint: Blister in private area (Wants to be tested for STDs/) and Open area on right hip   HPI Patient in today with 2 complaints: - blisters in private area- has had for over a week. Has no new sex partner.  - patient has above the knee amputation on right at hip area. Has what he thinks is a pressure ulcer on right hip from prosthesis. Area is red and sore to touch.  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  Eyes:  Negative for pain.  Respiratory:  Negative for shortness of breath.   Cardiovascular:  Negative for chest pain, palpitations and leg swelling.  Gastrointestinal:  Negative for abdominal pain.  Endocrine: Negative for polydipsia.  Skin:  Negative for rash.  Neurological:  Negative for dizziness, weakness and headaches.  Hematological:  Does not bruise/bleed easily.  All other systems reviewed and are negative.      Objective:   Physical Exam Constitutional:      Appearance: Normal appearance.  Cardiovascular:     Rate and Rhythm: Normal rate and regular rhythm.     Heart sounds: Normal heart sounds.  Pulmonary:      Breath sounds: Normal breath sounds.  Skin:    General: Skin is warm.     Comments: 10cm erythematous tender lesion on right hip Dried up vesicular lesion x2 on right side of penile shaft  Neurological:     General: No focal deficit present.     Mental Status: He is alert and oriented to person, place, and time.  Psychiatric:        Mood and Affect: Mood normal.        Behavior: Behavior normal.    BP 122/76   Pulse 93   Temp 98.1 F (36.7 C) (Temporal)   Ht 6' (1.829 m)   Wt 148 lb (67.1 kg)   SpO2 100%   BMI 20.07 kg/m         Assessment & Plan:  Johnny Lewis in today with chief complaint of Blister in private area (Wants to be tested for STDs/) and Open area on right hip   1. Pressure injury of right hip, stage 1 (Primary) May need to go without prosthesis until area heals Do not pick at area  2. Penile lesion Lab test pending    The above assessment and management plan was discussed with the patient. The patient verbalized understanding of and has agreed to the management plan. Patient is aware to call the clinic if symptoms persist or worsen. Patient is aware when to return to the clinic for a follow-up visit. Patient educated on when it is appropriate to  go to the emergency department.   Mary-Margaret Gladis, FNP    "

## 2024-02-12 LAB — HEPB+HEPC+HIV PANEL
HIV Screen 4th Generation wRfx: NONREACTIVE
Hep B C IgM: NEGATIVE
Hep B Core Total Ab: NEGATIVE
Hep B E Ab: NONREACTIVE
Hep B E Ag: NEGATIVE
Hep B Surface Ab, Qual: NONREACTIVE
Hep C Virus Ab: NONREACTIVE
Hepatitis B Surface Ag: NEGATIVE

## 2024-02-12 LAB — CT NG M GENITALIUM NAA, URINE
Chlamydia trachomatis, NAA: NEGATIVE
Mycoplasma genitalium NAA: NEGATIVE
Neisseria gonorrhoeae, NAA: NEGATIVE

## 2024-02-12 LAB — HSV DNA BY PCR (REFERENCE LAB)

## 2024-02-16 ENCOUNTER — Ambulatory Visit: Payer: Self-pay | Admitting: Nurse Practitioner

## 2024-03-08 ENCOUNTER — Other Ambulatory Visit: Payer: Self-pay

## 2024-03-24 ENCOUNTER — Encounter: Payer: Self-pay | Admitting: Physician Assistant

## 2024-03-24 ENCOUNTER — Ambulatory Visit: Admitting: Physician Assistant

## 2024-03-24 VITALS — BP 103/74

## 2024-03-24 DIAGNOSIS — L219 Seborrheic dermatitis, unspecified: Secondary | ICD-10-CM

## 2024-03-24 MED ORDER — HYDROCORTISONE 2.5 % EX CREA: TOPICAL_CREAM | CUTANEOUS | 11 refills | Status: AC

## 2024-03-24 MED ORDER — KETOCONAZOLE 2 % EX SHAM: 1.0000 | MEDICATED_SHAMPOO | Freq: Once | CUTANEOUS | 6 refills | Status: AC

## 2024-03-24 NOTE — Patient Instructions (Signed)

## 2024-03-24 NOTE — Progress Notes (Signed)
" ° °  New Patient Visit   Subjective  Johnny Lewis is a 34 y.o. male NEW PATIENT who presents for the following: His face breaks out in dry skin. It sometimes feels raised. It gets flaky and is around his nose, on his cheeks and chin. He is using Metronidazole  gel as needed for flares. It seems worse when he is stressed.    The following portions of the chart were reviewed this encounter and updated as appropriate: medications, allergies, medical history  Review of Systems:  No other skin or systemic complaints except as noted in HPI or Assessment and Plan.  Objective  Well appearing patient in no apparent distress; mood and affect are within normal limits.   A focused examination was performed of the following areas: Face   Relevant exam findings are noted in the Assessment and Plan.    Assessment & Plan   SEBORRHEIC DERMATITIS Exam: Pink patches with greasy scale at face   Seborrheic Dermatitis is a chronic persistent rash characterized by pinkness and scaling most commonly of the mid face but also can occur on the scalp (dandruff), ears; mid chest, mid back and groin.  It tends to be exacerbated by stress and cooler weather.  People who have neurologic disease may experience new onset or exacerbation of existing seborrheic dermatitis.  The condition is not curable but treatable and can be controlled.  Treatment Plan:  Discontinue Metronidazole  gel.  Start Ketoconazole  2% shampoo Wash face and scalp - let sit a few minutes then rinse.  Hydrocortisone  2.5% cream apply to affected areas twice daily until clear then decrease to 3 times per week    SEBORRHEIC DERMATITIS    Return if symptoms worsen or fail to improve.  I, Roseline Hutchinson, CMA, am acting as scribe for Dallan Schonberg K, PA-C .   Documentation: I have reviewed the above documentation for accuracy and completeness, and I agree with the above.  Azariah Latendresse K, PA-C    "
